# Patient Record
Sex: Male | Born: 2000 | Race: Black or African American | Hispanic: No | Marital: Single | Smoking: Current some day smoker
Health system: Southern US, Community
[De-identification: ages and names within clinical notes are randomized; demographics above are authoritative.]

## PROBLEM LIST (undated history)

## (undated) DIAGNOSIS — F909 Attention-deficit hyperactivity disorder, unspecified type: Secondary | ICD-10-CM

---
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
## 2021-01-16 ENCOUNTER — Other Ambulatory Visit: Payer: Self-pay

## 2021-01-16 ENCOUNTER — Encounter (HOSPITAL_COMMUNITY): Payer: Self-pay | Admitting: Emergency Medicine

## 2021-01-16 ENCOUNTER — Ambulatory Visit (HOSPITAL_COMMUNITY)
Admission: EM | Admit: 2021-01-16 | Discharge: 2021-01-16 | Disposition: A | Discharge status: Home / Self Care | Payer: Medicaid Other | Attending: Family Medicine | Admitting: Family Medicine

## 2021-01-16 DIAGNOSIS — K0889 Other specified disorders of teeth and supporting structures: Secondary | ICD-10-CM

## 2021-01-16 DIAGNOSIS — R030 Elevated blood-pressure reading, without diagnosis of hypertension: Secondary | ICD-10-CM

## 2021-01-16 MED ORDER — AMOXICILLIN 875 MG PO TABS: 875.0000 mg | ORAL_TABLET | Freq: Two times a day (BID) | ORAL | 0 refills | Status: AC

## 2021-01-16 MED ORDER — HYDROCODONE-ACETAMINOPHEN 5-325 MG PO TABS: 1.0000 | ORAL_TABLET | Freq: Four times a day (QID) | ORAL | 0 refills | Status: DC | PRN

## 2021-01-16 NOTE — ED Triage Notes (Signed)
Patient reports having right lower jaw pain.  Patient say he has tried to see a dentist, but appt (s) too far out on schedule.  Pain is in right lower jaw and right neck and top of right shoulder

## 2021-01-16 NOTE — Discharge Instructions (Addendum)
Be aware, you have been prescribed pain medications that may cause drowsiness. While taking this medication, do not take any other medications containing acetaminophen (Tylenol). Do not combine with alcohol or other illicit drugs. Please do not drive, operate heavy machinery, or take part in activities that require making important decisions while on this medication as your judgement may be clouded.  Your blood pressure was noted to be elevated during your visit today. If you are currently taking medication for high blood pressure, please ensure you are taking this as directed. If you do not have a history of high blood pressure and your blood pressure remains persistently elevated, you may need to begin taking a medication at some point. You may return here within the next few days to recheck if unable to see your primary care provider or if you do not have a one.  BP (!) 143/100 (BP Location: Left Arm)   Pulse 82   Temp 98.7 F (37.1 C) (Oral)   Resp 20   SpO2 98%   BP Readings from Last 3 Encounters:  01/16/21 (!) 143/100  12/21/20 106/68  12/20/20 (!) 108/93

## 2021-01-18 NOTE — ED Provider Notes (Signed)
[facility] CARE [facility]ENTER   [id number] 01/16/21 Arrival Time: 1528  ASSESSMENT & PLAN:  1. Pain, dental   2. Elevated blood pressure reading in office without diagnosis of hypertension    No sign of abscess requiring I&D at this time. Discussed.  Meds ordered this encounter  Medications   HYDROcodone-acetaminophen (NORCO/VICODIN) 5-325 MG tablet    Sig: Take 1 tablet by mouth every 6 (six) hours as needed for moderate pain or severe pain.    Dispense:  8 tablet    Refill:  0   amoxicillin (AMOXIL) 875 MG tablet    Sig: Take 1 tablet (875 mg total) by mouth 2 (two) times daily for 10 days.    Dispense:  20 tablet    Refill:  0    [place] Controlled Substances Registry consulted for this patient. I feel the risk/benefit ratio today is favorable for proceeding with this prescription for a controlled substance. Medication sedation precautions given.  Dental resource written instructions given. He will schedule dental evaluation as soon as possible if not improving over the next 24-48 hours.    Discharge Instructions      Be aware, you have been prescribed pain medications that may cause drowsiness. While taking this medication, do not take any other medications containing acetaminophen (Tylenol). Do not combine with alcohol or other illicit drugs. Please do not drive, operate heavy machinery, or take part in activities that require making important decisions while on this medication as your judgement may be clouded.  Your blood pressure was noted to be elevated during your visit today. If you are currently taking medication for high blood pressure, please ensure you are taking this as directed. If you do not have a history of high blood pressure and your blood pressure remains persistently elevated, you may need to begin taking a medication at some point. You may return here within the next few days to recheck if unable to see your primary care provider or if you do not have a one.  BP (!)  143/100 (BP Location: Left Arm)   Pulse 82   Temp 98.7 F (37.1 C) (Oral)   Resp 20   SpO2 98%   BP Readings from Last 3 Encounters:  01/16/21 (!) 143/100  12/21/20 106/68  12/20/20 (!) 108/93        Reviewed expectations re: course of current medical issues. Questions answered. Outlined signs and symptoms indicating need for more acute intervention. Patient verbalized understanding. After Visit Summary given.   SUBJECTIVE:  Dan Morris is a 20 y.o. male who reports gradual onset of R lower tooth/jaw pain; fairly persistent; present few days. Fever: absent. Tolerating PO intake but reports pain with chewing. Normal swallowing. He does not see a dentist regularly. No neck swelling or pain. OTC analgesics without relief.  Increased blood pressure noted today. Reports that he has not been treated for hypertension in the past.  He reports no chest pain on exertion, no dyspnea on exertion, no swelling of ankles, no orthostatic dizziness or lightheadedness, no orthopnea or paroxysmal nocturnal dyspnea, and no palpitations.   OBJECTIVE: Vitals:   01/16/21 1550  BP: (!) 143/100  Pulse: 82  Resp: 20  Temp: 98.7 F (37.1 C)  TempSrc: Oral  SpO2: 98%    General appearance: alert; no distress HENT: normocephalic; atraumatic; dentition: poor; right lower gum without areas of fluctuance, drainage, or bleeding and with tenderness to palpation; normal jaw movement without difficulty Neck: supple without LAD; FROM; trachea midline CV: reg Lungs:  normal respirations; unlabored; speaks full sentences without difficulty Skin: warm and dry Psychological: alert and cooperative; normal mood and affect  No Known Allergies  Past Medical History:  Diagnosis Date   ADHD (attention deficit hyperactivity disorder)    Social History   Socioeconomic History   Marital status: Single    Spouse name: Not on file   Number of children: Not on file   Years of education: Not on file    Highest education level: Not on file  Occupational History   Not on file  Tobacco Use   Smoking status: Never   Smokeless tobacco: Never  Vaping Use   Vaping Use: Never used  Substance and Sexual Activity   Alcohol use: No   Drug use: No   Sexual activity: Not on file  Other Topics Concern   Not on file  Social History Narrative   Not on file   Social Determinants of Health   Financial Resource Strain: Not on file  Food Insecurity: Not on file  Transportation Needs: Not on file  Physical Activity: Not on file  Stress: Not on file  Social Connections: Not on file  Intimate Partner Violence: Not on file   No family history on file. History reviewed. No pertinent surgical history.    Mardella Layman, MD 01/18/21 1145

## 2021-03-12 ENCOUNTER — Other Ambulatory Visit (HOSPITAL_COMMUNITY): Payer: Self-pay

## 2021-06-02 ENCOUNTER — Encounter (HOSPITAL_BASED_OUTPATIENT_CLINIC_OR_DEPARTMENT_OTHER): Payer: Self-pay | Admitting: *Deleted

## 2021-06-02 ENCOUNTER — Emergency Department (HOSPITAL_BASED_OUTPATIENT_CLINIC_OR_DEPARTMENT_OTHER)
Admission: EM | Admit: 2021-06-02 | Discharge: 2021-06-02 | Disposition: A | Discharge status: Home / Self Care | Payer: Medicaid Other | Attending: Emergency Medicine | Admitting: Emergency Medicine

## 2021-06-02 ENCOUNTER — Other Ambulatory Visit: Payer: Self-pay

## 2021-06-02 DIAGNOSIS — Z79899 Other long term (current) drug therapy: Secondary | ICD-10-CM | POA: Diagnosis not present

## 2021-06-02 DIAGNOSIS — K0889 Other specified disorders of teeth and supporting structures: Secondary | ICD-10-CM | POA: Diagnosis present

## 2021-06-02 MED ORDER — AMOXICILLIN 500 MG PO CAPS
500.0000 mg | ORAL_CAPSULE | Freq: Three times a day (TID) | ORAL | 0 refills | Status: AC
Start: 2021-06-02 — End: ?

## 2021-06-02 MED ORDER — NAPROXEN 375 MG PO TABS
375.0000 mg | ORAL_TABLET | Freq: Two times a day (BID) | ORAL | 0 refills | Status: AC
Start: 2021-06-02 — End: ?

## 2021-06-02 NOTE — ED Provider Notes (Signed)
[place] EMERGENCY DEPARTMENT Provider Note   CSN: [id number] Arrival date & time: 06/02/21  1652     History  Chief Complaint  Patient presents with   Dental Pain    Dan Morris is a 21 y.o. male.  Patient presents with right lower dental pain x 1 day. Patient with known broken tooth. No fevers or chills. No trismus or difficulty swallowing.  The history is provided by the patient. No language interpreter was used.  Dental Pain Location:  Lower Lower teeth location:  30/RL 1st molar Quality:  Constant and localized Severity:  Moderate Onset quality:  Sudden Duration:  1 day Timing:  Constant Progression:  Worsening Context: dental fracture   Previous work-up:  Dental exam Associated symptoms: no difficulty swallowing, no drooling, no facial swelling, no fever and no gum swelling   Risk factors: no diabetes and no periodontal disease       Home Medications Prior to Admission medications   Medication Sig Start Date End Date Taking? Authorizing Provider  HYDROcodone-acetaminophen (NORCO/VICODIN) 5-325 MG tablet Take 1 tablet by mouth every 6 (six) hours as needed for moderate pain or severe pain. 01/16/21   Vanessa Kick, MD  lisdexamfetamine (VYVANSE) 50 MG capsule Take 50 mg by mouth daily.    [provider]  methocarbamol (ROBAXIN) 500 MG tablet Take 1 tablet (500 mg total) by mouth 3 (three) times daily as needed for muscle spasms. Patient not taking: Reported on 01/16/2021 12/21/20   Margarita Mail, PA-C  naproxen (NAPROSYN) 375 MG tablet Take 1 tablet (375 mg total) by mouth 2 (two) times daily with a meal. Patient not taking: Reported on 01/16/2021 12/21/20   Margarita Mail, PA-C      Allergies    Patient has no known allergies.    Review of Systems   Review of Systems  Constitutional:  Negative for fever.  HENT:  Positive for dental problem. Negative for drooling and facial swelling.   All other systems reviewed and are  negative.  Physical Exam Updated Vital Signs BP (!) 159/111 (BP Location: Right Arm) Comment: crying in triage , thrashing around in chair   Pulse (!) 51    Temp 98 F (36.7 C)    Resp 16    Ht 6\' 3"  (1.905 m)    Wt 78.9 kg    SpO2 99%    BMI 21.75 kg/m  Physical Exam Constitutional:      Appearance: Normal appearance.  HENT:     Head: Normocephalic.     Nose: Nose normal.     Mouth/Throat:     Mouth: Mucous membranes are moist.  Eyes:     Conjunctiva/sclera: Conjunctivae normal.  Cardiovascular:     Rate and Rhythm: Normal rate.  Pulmonary:     Effort: Pulmonary effort is normal.  Abdominal:     Palpations: Abdomen is soft.  Musculoskeletal:        General: Normal range of motion.     Cervical back: Normal range of motion and neck supple.  Skin:    General: Skin is warm and dry.  Neurological:     Mental Status: He is alert and oriented to person, place, and time.  Psychiatric:        Behavior: Behavior normal.    ED Results / Procedures / Treatments   Labs (all labs ordered are listed, but only abnormal results are displayed) Labs Reviewed - No data to display  EKG None  Radiology No results found.  Procedures  Procedures    Medications Ordered in ED Medications - No data to display  ED Course/ Medical Decision Making/ A&P                           Medical Decision Making  Patient with dentalgia.  No abscess requiring immediate incision and drainage.  Exam not concerning for Ludwig's angina or pharyngeal abscess.  Will treat with amoxicillin and naproxen. Pt instructed to follow-up with dentist.  Discussed return precautions. Pt safe for discharge.         Final Clinical Impression(s) / ED Diagnoses Final diagnoses:  Tooth pain    Rx / DC Orders ED Discharge Orders          Ordered    amoxicillin (AMOXIL) 500 MG capsule  3 times daily        06/02/21 1917    naproxen (NAPROSYN) 375 MG tablet  2 times daily        06/02/21 1917               Etta Quill, NP 06/02/21 1918    Orpah Greek, MD 06/04/21 [id number]

## 2021-06-02 NOTE — Discharge Instructions (Addendum)
Please refer to the attached instructions. Take medication as directed. Follow-up with a dental provider.

## 2021-06-02 NOTE — ED Triage Notes (Signed)
C/o dental pain x 1 hr

## 2021-07-27 ENCOUNTER — Emergency Department (HOSPITAL_BASED_OUTPATIENT_CLINIC_OR_DEPARTMENT_OTHER)
Admission: EM | Admit: 2021-07-27 | Discharge: 2021-07-27 | Disposition: A | Discharge status: Home / Self Care | Payer: Medicaid Other | Attending: Emergency Medicine | Admitting: Emergency Medicine

## 2021-07-27 ENCOUNTER — Other Ambulatory Visit (HOSPITAL_BASED_OUTPATIENT_CLINIC_OR_DEPARTMENT_OTHER): Payer: Self-pay

## 2021-07-27 ENCOUNTER — Encounter (HOSPITAL_BASED_OUTPATIENT_CLINIC_OR_DEPARTMENT_OTHER): Payer: Self-pay | Admitting: *Deleted

## 2021-07-27 ENCOUNTER — Other Ambulatory Visit: Payer: Self-pay

## 2021-07-27 DIAGNOSIS — J029 Acute pharyngitis, unspecified: Secondary | ICD-10-CM | POA: Insufficient documentation

## 2021-07-27 DIAGNOSIS — Z20822 Contact with and (suspected) exposure to covid-19: Secondary | ICD-10-CM | POA: Diagnosis not present

## 2021-07-27 LAB — RESP PANEL BY RT-PCR (FLU A&B, COVID) ARPGX2
Influenza A by PCR: NEGATIVE
Influenza B by PCR: NEGATIVE
SARS Coronavirus 2 by RT PCR: NEGATIVE

## 2021-07-27 LAB — GROUP A STREP BY PCR: Group A Strep by PCR: NOT DETECTED

## 2021-07-27 MED ORDER — DEXAMETHASONE 10 MG/ML FOR PEDIATRIC ORAL USE
10.0000 mg | Freq: Once | INTRAMUSCULAR | Status: AC
  Administered 2021-07-27: 10 mg via ORAL
  Filled 2021-07-27: qty 1

## 2021-07-27 MED ORDER — CLINDAMYCIN HCL 150 MG PO CAPS
300.0000 mg | ORAL_CAPSULE | Freq: Three times a day (TID) | ORAL | 0 refills | Status: AC
  Filled 2021-07-27: qty 60, 10d supply, fill #0

## 2021-07-27 NOTE — Discharge Instructions (Addendum)
I have prescribed you antibiotics for suspected bacterial pharyngitis. Please return to ED if symptoms continue to worsen. ?

## 2021-07-27 NOTE — ED Triage Notes (Signed)
Sore throat, chills, body aches since yesterday. States his boss has the flu ?

## 2021-07-27 NOTE — ED Provider Notes (Signed)
?[facility] EMERGENCY DEPARTMENT ?Provider Note ? ? ?CSN: [id number] ?Arrival date & time: 07/27/21  1553 ? ?  ? ?History ? ?Chief Complaint  ?Patient presents with  ? Sore Throat  ? ? ?Dan Morris is a 21 y.o. male. ?Sore throat ongoing for 4 days. He has general malaise, fatigue, and weakness.  He has also had fevers. No ear pain, difficulty tolerating PO, or neck swelling. ? ?Sore Throat ? ? ?  ? ?Home Medications ?Prior to Admission medications   ?Medication Sig Start Date End Date Taking? Authorizing Provider  ?clindamycin (CLEOCIN) 150 MG capsule Take 2 capsules (300 mg total) by mouth 3 (three) times daily for 10 days. 07/27/21 08/06/21 Yes Trina Asch, Finis BudGrace C, PA-C  ?amoxicillin (AMOXIL) 500 MG capsule Take 1 capsule (500 mg total) by mouth 3 (three) times daily. 06/02/21   Felicie MornSmith, David, NP  ?HYDROcodone-acetaminophen (NORCO/VICODIN) 5-325 MG tablet Take 1 tablet by mouth every 6 (six) hours as needed for moderate pain or severe pain. 01/16/21   Mardella LaymanHagler, Brian, MD  ?lisdexamfetamine (VYVANSE) 50 MG capsule Take 50 mg by mouth daily.    [provider]  ?methocarbamol (ROBAXIN) 500 MG tablet Take 1 tablet (500 mg total) by mouth 3 (three) times daily as needed for muscle spasms. ?Patient not taking: Reported on 01/16/2021 12/21/20   Arthor CaptainHarris, Abigail, PA-C  ?naproxen (NAPROSYN) 375 MG tablet Take 1 tablet (375 mg total) by mouth 2 (two) times daily with a meal. ?Patient not taking: Reported on 01/16/2021 12/21/20   Arthor CaptainHarris, Abigail, PA-C  ?naproxen (NAPROSYN) 375 MG tablet Take 1 tablet (375 mg total) by mouth 2 (two) times daily. 06/02/21   Felicie MornSmith, David, NP  ?   ? ?Allergies    ?Patient has no known allergies.   ? ?Review of Systems   ?Review of Systems  ?Constitutional:  Positive for chills, fatigue and fever.  ?HENT:  Positive for sore throat. Negative for congestion and ear pain.   ?Respiratory:  Negative for cough.   ?All other systems reviewed and are negative. ? ?Physical Exam ?Updated Vital  Signs ?BP 134/85 (BP Location: Right Arm)   Pulse 93   Temp 98.8 ?F (37.1 ?C) (Oral)   Resp 16   Ht 6\' 4"  (1.93 m)   SpO2 100%   BMI 21.18 kg/m?  ?Physical Exam ?Vitals and nursing note reviewed.  ?Constitutional:   ?   General: He is not in acute distress. ?   Appearance: Normal appearance. He is well-developed. He is not ill-appearing, toxic-appearing or diaphoretic.  ?HENT:  ?   Head: Normocephalic and atraumatic.  ?   Right Ear: Tympanic membrane and ear canal normal. No drainage, swelling or tenderness. No middle ear effusion. Tympanic membrane is not erythematous.  ?   Left Ear: Tympanic membrane and ear canal normal. No drainage, swelling or tenderness.  No middle ear effusion. Tympanic membrane is not erythematous.  ?   Nose: No nasal deformity, congestion or rhinorrhea.  ?   Mouth/Throat:  ?   Lips: Pink. No lesions.  ?   Mouth: Mucous membranes are moist.  ?   Pharynx: Uvula midline. Oropharyngeal exudate, posterior oropharyngeal erythema and uvula swelling present.  ?   Tonsils: Tonsillar exudate present. No tonsillar abscesses. 2+ on the right. 2+ on the left.  ?Eyes:  ?   General: Gaze aligned appropriately. No scleral icterus.    ?   Right eye: No discharge.     ?   Left eye: No discharge.  ?  Conjunctiva/sclera: Conjunctivae normal.  ?   Right eye: Right conjunctiva is not injected. No exudate or hemorrhage. ?   Left eye: Left conjunctiva is not injected. No exudate or hemorrhage. ?Neck:  ?   Comments: Anterior cervical adenopathy bilaterally ?Cardiovascular:  ?   Rate and Rhythm: Normal rate and regular rhythm.  ?   Heart sounds: Normal heart sounds. No murmur heard. ?  No friction rub. No gallop.  ?Pulmonary:  ?   Effort: Pulmonary effort is normal. No respiratory distress.  ?   Breath sounds: Normal breath sounds. No stridor. No wheezing, rhonchi or rales.  ?Chest:  ?   Chest wall: No tenderness.  ?Abdominal:  ?   General: There is no distension.  ?   Palpations: Abdomen is soft.  ?    Tenderness: There is no abdominal tenderness. There is no guarding or rebound.  ?Lymphadenopathy:  ?   Cervical: Cervical adenopathy present.  ?Skin: ?   General: Skin is warm and dry.  ?Neurological:  ?   Mental Status: He is alert and oriented to person, place, and time.  ?Psychiatric:     ?   Mood and Affect: Mood normal.     ?   Speech: Speech normal.     ?   Behavior: Behavior normal. Behavior is cooperative.  ? ? ?ED Results / Procedures / Treatments   ?Labs ?(all labs ordered are listed, but only abnormal results are displayed) ?Labs Reviewed  ?RESP PANEL BY RT-PCR (FLU A&B, COVID) [id number]  ?GROUP A STREP BY PCR  ? ? ?EKG ?None ? ?Radiology ?No results found. ? ?Procedures ?Procedures  ? ? ?Medications Ordered in ED ?Medications  ?dexamethasone (DECADRON) 10 MG/ML injection for Pediatric ORAL use 10 mg (10 mg Oral Given 07/27/21 1706)  ? ? ?ED Course/ Medical Decision Making/ A&P ?  ?                        ?Medical Decision Making ?Risk ?Prescription drug management. ? ? ? ?MDM  ?This is a 21 y.o. male who presents to the ED with sore throat. ?The differential of this patient includes but is not limited to Gonorrhea/Chlamydia, Ludwig's Angina, Peritonsillar Abscess, Retropharyngeal Abscess, Strep throat, and Viral Pharyngitis ? ? ?My Impression, Plan, and ED Course: Patient with normal vitals. Well appearing. HENT exam is slightly concerning with tonsillar exudates, bilateral swelling, erythema, and uvular swelling. He has anterior lymphadenopathy as well as reported fever. This is highly suspicious for strep throat. Doubt PTA, RPA, Ludwigs,.  ? ?I personally ordered, reviewed, and interpreted all laboratory work and imaging and agree with radiologist interpretation. Results interpreted below: COVID/Flu, strep negative ? ? ?Despite negative test results, I am still concerned that this is a false negative and bacterial cause of symptoms are present. Will treat ppx with Clinda and have patient return if symptoms  worsen . ? ?Charting Requirements ?Additional history is obtained from:  Independent historian ?External Records from outside source obtained and reviewed including: n/a ?Social Determinants of Health:  none ?Pertinant PMH that complicates patient's illness: n/a ? ?Patient Care ?Problems that were addressed during this visit: ?- Sore throat: Acute illness with complication ?Medications given in ED: Decadron ?Reevaluation of the patient after these medicines showed that the patient stayed the same ?Disposition: abx, return precautions ? ? ?Portions of this note were generated with Scientist, clinical (histocompatibility and immunogenetics). Dictation errors may occur despite best attempts at proofreading. ?  ? ?  ?  ? ?  Final Clinical Impression(s) / ED Diagnoses ?Final diagnoses:  ?Pharyngitis, unspecified etiology  ? ? ?Rx / DC Orders ?ED Discharge Orders   ? ?      Ordered  ?  clindamycin (CLEOCIN) 150 MG capsule  3 times daily       ? 07/27/21 1700  ? ?  ?  ? ?  ? ? ?  ?Claudie Leach, PA-C ?07/27/21 1935 ? ?  ?Milagros Loll, MD ?07/30/21 1700 ? ?
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
## 2022-09-04 ENCOUNTER — Other Ambulatory Visit: Payer: Self-pay

## 2022-09-04 ENCOUNTER — Emergency Department (HOSPITAL_BASED_OUTPATIENT_CLINIC_OR_DEPARTMENT_OTHER): Payer: Medicaid Other

## 2022-09-04 ENCOUNTER — Emergency Department (HOSPITAL_BASED_OUTPATIENT_CLINIC_OR_DEPARTMENT_OTHER)
Admission: EM | Admit: 2022-09-04 | Discharge: 2022-09-04 | Disposition: A | Discharge status: Home / Self Care | Payer: Medicaid Other | Attending: Emergency Medicine | Admitting: Emergency Medicine

## 2022-09-04 ENCOUNTER — Encounter (HOSPITAL_BASED_OUTPATIENT_CLINIC_OR_DEPARTMENT_OTHER): Payer: Self-pay

## 2022-09-04 DIAGNOSIS — R1110 Vomiting, unspecified: Secondary | ICD-10-CM

## 2022-09-04 DIAGNOSIS — R112 Nausea with vomiting, unspecified: Secondary | ICD-10-CM | POA: Insufficient documentation

## 2022-09-04 DIAGNOSIS — R1013 Epigastric pain: Secondary | ICD-10-CM | POA: Insufficient documentation

## 2022-09-04 LAB — COMPREHENSIVE METABOLIC PANEL
ALT: 17 U/L (ref 0–44)
AST: 24 U/L (ref 15–41)
Albumin: 4.5 g/dL (ref 3.5–5.0)
Alkaline Phosphatase: 32 U/L — ABNORMAL LOW (ref 38–126)
Anion gap: 11 (ref 5–15)
BUN: 25 mg/dL — ABNORMAL HIGH (ref 6–20)
CO2: 23 mmol/L (ref 22–32)
Calcium: 9.1 mg/dL (ref 8.9–10.3)
Chloride: 102 mmol/L (ref 98–111)
Creatinine, Ser: 1.12 mg/dL (ref 0.61–1.24)
GFR, Estimated: >60 mL/min (ref >60)
Glucose, Bld: 100 mg/dL — ABNORMAL HIGH (ref 70–99)
Potassium: 3.4 mmol/L — ABNORMAL LOW (ref 3.5–5.1)
Sodium: 136 mmol/L (ref 135–145)
Total Bilirubin: 1.5 mg/dL — ABNORMAL HIGH (ref 0.3–1.2)
Total Protein: 7.8 g/dL (ref 6.5–8.1)

## 2022-09-04 LAB — CBC WITH DIFFERENTIAL/PLATELET
Abs Immature Granulocytes: 0.01 10*3/uL (ref 0.00–0.07)
Basophils Absolute: 0 10*3/uL (ref 0.0–0.1)
Basophils Relative: 0 %
Eosinophils Absolute: 0 10*3/uL (ref 0.0–0.5)
Eosinophils Relative: 0 %
HCT: 45 % (ref 39.0–52.0)
Hemoglobin: 15.4 g/dL (ref 13.0–17.0)
Immature Granulocytes: 0 %
Lymphocytes Relative: 17 %
Lymphs Abs: 1.2 10*3/uL (ref 0.7–4.0)
MCH: 29.3 pg (ref 26.0–34.0)
MCHC: 34.2 g/dL (ref 30.0–36.0)
MCV: 85.7 fL (ref 80.0–100.0)
Monocytes Absolute: 0.7 10*3/uL (ref 0.1–1.0)
Monocytes Relative: 11 %
Neutro Abs: 4.9 10*3/uL (ref 1.7–7.7)
Neutrophils Relative %: 72 %
Platelets: 288 10*3/uL (ref 150–400)
RBC: 5.25 MIL/uL (ref 4.22–5.81)
RDW: 12.5 % (ref 11.5–15.5)
WBC: 6.8 10*3/uL (ref 4.0–10.5)
nRBC: 0 % (ref 0.0–0.2)

## 2022-09-04 LAB — LIPASE, BLOOD: Lipase: 23 U/L (ref 11–51)

## 2022-09-04 MED ORDER — SUCRALFATE 1 G PO TABS: 1.0000 g | ORAL_TABLET | Freq: Three times a day (TID) | ORAL | 0 refills | Status: AC

## 2022-09-04 MED ORDER — ONDANSETRON HCL 4 MG/2ML IJ SOLN
4.0000 mg | Freq: Once | INTRAMUSCULAR | Status: AC
  Administered 2022-09-04: 4 mg via INTRAVENOUS
  Filled 2022-09-04: qty 2

## 2022-09-04 MED ORDER — ONDANSETRON 4 MG PO TBDP: 4.0000 mg | ORAL_TABLET | Freq: Three times a day (TID) | ORAL | 0 refills | Status: AC | PRN

## 2022-09-04 MED ORDER — PANTOPRAZOLE SODIUM 20 MG PO TBEC: 20.0000 mg | DELAYED_RELEASE_TABLET | Freq: Every day | ORAL | 0 refills | Status: AC

## 2022-09-04 MED ORDER — ALUM & MAG HYDROXIDE-SIMETH 200-200-20 MG/5ML PO SUSP
30.0000 mL | Freq: Once | ORAL | Status: AC
  Administered 2022-09-04: 30 mL via ORAL
  Filled 2022-09-04: qty 30

## 2022-09-04 MED ORDER — SODIUM CHLORIDE 0.9 % IV BOLUS
1000.0000 mL | Freq: Once | INTRAVENOUS | Status: AC
  Administered 2022-09-04: 1000 mL via INTRAVENOUS

## 2022-09-04 NOTE — ED Triage Notes (Signed)
Pt reports that he came to ED for complaints of chest pain and SOB. States it started this am.

## 2022-09-04 NOTE — ED Provider Notes (Signed)
[facility] AT [facility] Provider Note   CSN: [id number] Arrival date & time: 09/04/22  0706     History  Chief Complaint  Patient presents with   Emesis   Abdominal Pain   Chest Pain    Dan Morris is a 22 y.o. male.  Patient here with nausea vomiting episode.  Upper abdominal pain radiating to his chest.  Denies any suspicious food intake or alcohol use or heavy ibuprofen use.  Denies any black or bloody stools.  Denies any fevers or chills or cough or sputum production.  No significant medical history.  Denies any weakness, numbness, chills.  The history is provided by the patient.       Home Medications Prior to Admission medications   Medication Sig Start Date End Date Taking? Authorizing Provider  ondansetron (ZOFRAN-ODT) 4 MG disintegrating tablet Take 1 tablet (4 mg total) by mouth every 8 (eight) hours as needed for nausea or vomiting. 09/04/22  Yes Lehua Flores, DO  pantoprazole (PROTONIX) 20 MG tablet Take 1 tablet (20 mg total) by mouth daily for 14 days. 09/04/22 09/18/22 Yes Yola Paradiso, DO  sucralfate (CARAFATE) 1 g tablet Take 1 tablet (1 g total) by mouth 4 (four) times daily -  with meals and at bedtime for 14 days. 09/04/22 09/18/22 Yes Kateryna Grantham, DO  amoxicillin (AMOXIL) 500 MG capsule Take 1 capsule (500 mg total) by mouth 3 (three) times daily. 06/02/21   Felicie Morn, NP  HYDROcodone-acetaminophen (NORCO/VICODIN) 5-325 MG tablet Take 1 tablet by mouth every 6 (six) hours as needed for moderate pain or severe pain. 01/16/21   Mardella Layman, MD  lisdexamfetamine (VYVANSE) 50 MG capsule Take 50 mg by mouth daily.    [provider]  methocarbamol (ROBAXIN) 500 MG tablet Take 1 tablet (500 mg total) by mouth 3 (three) times daily as needed for muscle spasms. Patient not taking: Reported on 01/16/2021 12/21/20   Arthor Captain, PA-C  naproxen (NAPROSYN) 375 MG tablet Take 1 tablet (375 mg total) by mouth 2 (two)  times daily with a meal. Patient not taking: Reported on 01/16/2021 12/21/20   Arthor Captain, PA-C  naproxen (NAPROSYN) 375 MG tablet Take 1 tablet (375 mg total) by mouth 2 (two) times daily. 06/02/21   Felicie Morn, NP      Allergies    Patient has no known allergies.    Review of Systems   Review of Systems  Physical Exam Updated Vital Signs BP 137/85 (BP Location: Left Arm)   Pulse 85   Temp 98.1 F (36.7 C) (Oral)   Resp 18   Ht 6\' 3"  (1.905 m)   Wt 79.4 kg   SpO2 100%   BMI 21.87 kg/m  Physical Exam Vitals and nursing note reviewed.  Constitutional:      General: He is not in acute distress.    Appearance: He is well-developed.  HENT:     Head: Normocephalic and atraumatic.     Mouth/Throat:     Mouth: Mucous membranes are moist.  Eyes:     Extraocular Movements: Extraocular movements intact.     Conjunctiva/sclera: Conjunctivae normal.     Pupils: Pupils are equal, round, and reactive to light.  Cardiovascular:     Rate and Rhythm: Normal rate and regular rhythm.     Heart sounds: No murmur heard. Pulmonary:     Effort: Pulmonary effort is normal. No respiratory distress.     Breath sounds: Normal breath sounds.  Abdominal:  Palpations: Abdomen is soft.     Tenderness: There is abdominal tenderness in the epigastric area.  Musculoskeletal:        General: No swelling.     Cervical back: Neck supple.  Skin:    General: Skin is warm and dry.     Capillary Refill: Capillary refill takes less than 2 seconds.  Neurological:     General: No focal deficit present.     Mental Status: He is alert.  Psychiatric:        Mood and Affect: Mood normal.     ED Results / Procedures / Treatments   Labs (all labs ordered are listed, but only abnormal results are displayed) Labs Reviewed  COMPREHENSIVE METABOLIC PANEL - Abnormal; Notable for the following components:      Result Value   Potassium 3.4 (*)    Glucose, Bld 100 (*)    BUN 25 (*)    Alkaline  Phosphatase 32 (*)    Total Bilirubin 1.5 (*)    All other components within normal limits  CBC WITH DIFFERENTIAL/PLATELET  LIPASE, BLOOD    EKG EKG Interpretation  Date/Time:  Sunday Sep 04 2022 07:18:06 EDT Ventricular Rate:  85 PR Interval:  159 QRS Duration: 92 QT Interval:  353 QTC Calculation: 420 R Axis:   91 Text Interpretation: Sinus rhythm Borderline right axis deviation LVH by voltage Confirmed by Virgina Norfolk ([id number]) on 09/04/2022 7:31:57 AM  Radiology DG Chest Portable 1 View  Result Date: 09/04/2022 CLINICAL DATA:  22 year old male with history of shortness of breath. EXAM: PORTABLE CHEST 1 VIEW COMPARISON:  Chest x-ray 06/09/2020. FINDINGS: Lung volumes are normal. No consolidative airspace disease. No pleural effusions. No pneumothorax. Multiple small calcified granulomas are noted in the lungs, right greater than left. No other larger suspicious appearing pulmonary nodule or mass noted. Pulmonary vasculature and the cardiomediastinal silhouette are within normal limits. IMPRESSION: 1. No radiographic evidence of acute cardiopulmonary disease. 2. Old granulomatous disease, as above. Electronically Signed   By: Trudie Reed M.D.   On: 09/04/2022 08:07    Procedures Procedures    Medications Ordered in ED Medications  alum & mag hydroxide-simeth (MAALOX/MYLANTA) 200-200-20 MG/5ML suspension 30 mL (30 mLs Oral Given 09/04/22 0737)  ondansetron (ZOFRAN) injection 4 mg (4 mg Intravenous Given 09/04/22 0737)  sodium chloride 0.9 % bolus 1,000 mL (1,000 mLs Intravenous New Bag/Given 09/04/22 0736)    ED Course/ Medical Decision Making/ A&P                             Medical Decision Making Amount and/or Complexity of Data Reviewed Labs: ordered. Radiology: ordered.  Risk OTC drugs. Prescription drug management.   Dan Morris is here with upper abdominal pain/chest pain.  Normal vitals.  No fever.  Differential diagnosis likely stomach  inflammation/viral process.  EKG shows sinus rhythm.  No ischemic changes.  Doubt ACS or cardiac process.  Will get chest x-ray to evaluate for pneumonia, pneumothorax, will check labs to evaluate for pancreatitis, cholecystitis.  Will give GI cocktail, Zofran, IV fluids and reevaluate.  Per my review interpretation labs no significant anemia, electrolyte abnormality, kidney injury or leukocytosis.  Chest x-ray per my review and interpretation shows no evidence of pneumonia or pneumothorax.  EKG shows sinus rhythm.  No ischemic changes.  Overall he is feeling much better after GI cocktail, Zofran and fluids.  I do suspect that this is more GI related pain.  Will start him on Carafate and Protonix and will prescribe him Zofran.  He understands return precautions.  Discharged in good condition.  This chart was dictated using voice recognition software.  Despite best efforts to proofread,  errors can occur which can change the documentation meaning.         Final Clinical Impression(s) / ED Diagnoses Final diagnoses:  Vomiting, unspecified vomiting type, unspecified whether nausea present    Rx / DC Orders ED Discharge Orders          Ordered    ondansetron (ZOFRAN-ODT) 4 MG disintegrating tablet  Every 8 hours PRN        09/04/22 0817    pantoprazole (PROTONIX) 20 MG tablet  Daily        09/04/22 0817    sucralfate (CARAFATE) 1 g tablet  3 times daily with meals & bedtime        09/04/22 0817              Virgina Norfolk, DO 09/04/22 0818

## 2023-02-18 NOTE — ED Triage Notes (Signed)
 Removal of stitches left hand, thumb

## 2023-02-18 NOTE — ED Provider Notes (Signed)
 [facility] Emergency Department Emergency Department Provider Note  This document was created using the aid of voice recognition Dragon dictation software.   Provider at bedside: 02/18/2023 3:18 PM  History obtained from the: Patient  History   Chief Complaint  Patient presents with   Suture / Staple Removal    HPI  Dan Morris is a 22 y.o. male who presents to the ED with complaints of suture removal.  Patient has a laceration to the left thumb 7 days ago and had sutures placed in this ED.  He has been applying ointment that was given to him however he does admit to not keeping the area covered.  He denies any symptoms and states he feels well.  ______________________ ROS: Pertinent positives and negatives per HPI. Pertinent past medical, surgical, social and family history records were reviewed. Current Medications and Allergies were reviewed.  Physical Exam   Vitals:   02/18/23 1505 02/18/23 1507  BP: (!) 161/68   BP Location: Right arm   Patient Position: Sitting   Pulse: 85   Resp: 16   Temp: 98.2 F (36.8 C)   TempSrc: Oral   SpO2: 98% 98%    Physical Exam Vitals reviewed.  Constitutional:      General: He is not in acute distress.    Appearance: Normal appearance. He is not ill-appearing.  HENT:     Head: Normocephalic and atraumatic.     Nose: Nose normal.  Eyes:     Conjunctiva/sclera: Conjunctivae normal.  Cardiovascular:     Pulses: Normal pulses.  Pulmonary:     Effort: Pulmonary effort is normal.  Musculoskeletal:        General: Normal range of motion.     Cervical back: Normal range of motion.  Skin:    Findings: Wound present.     Comments: Well healed appearing wound to the left thumb on the dorsal side with 5 intact sutures.  No active drainage, swelling, tenderness, warmth or erythema.  Neurological:     Mental Status: He is alert. Mental status is at baseline.     Results  EKG Impression:  (Interpreted by  me)  ED Course     Clinical Complexity  Patient's presentation is most consistent with acute, uncomplicated illness.  Patient's Impaired access to primary care increases the complexity of managing their  presentation with suture.    Provider time spent in patient care today, inclusive of but not limited to clinical reassessment, review of diagnostic studies, and discharge preparation, was less than 30 minutes.   Procedure Note  Suture removal  Date/Time: 02/18/2023 3:00 PM  Performed by: Sheree Westley Tamea Mickey., PA-C Authorized by: Alm Ozell Perry, MD   Consent:    Consent obtained:  Verbal   Consent given by:  Patient   Risks, benefits, and alternatives were discussed: yes     Risks discussed:  Bleeding, pain and wound separation   Alternatives discussed:  Delayed treatment Universal protocol:    Procedure explained and questions answered to patient or proxy's satisfaction: yes     Patient identity confirmed:  Verbally with patient and arm band Location:    Location:  Upper extremity   Upper extremity location:  Hand   Hand location:  L thumb Procedure details:    Wound appearance:  Good wound healing, clean and draining   Drainage characteristics:  Pinpoint amount of grey drained after first stitch removal.   Number of sutures removed:  5 Post-procedure details:  Post-removal:  No dressing applied   Procedure completion:  Tolerated well, no immediate complications   Medical Decision Making  Medical Decision Making 22 year old male presents to the ED for suture removal.  Upon his evaluation patient is stable, nontoxic and in no acute distress.  Initial vital signs were significant for elevated blood pressure.  Physical exam was significant for finding noted above.  Wound appears clean, dry, and intact with no signs of evidence.  Sutures were removed and patient tolerated procedure well.  There was a pinpoint amount of gray discharge that showed on removal of the  first stitch  however at the removing all sutures there was no further evidence of further drainage.  I do not believe antibiotics are necessary at this time due to reassuring physical exam and reevaluation of the wound after all sutures were removed.  At this time patient is stable and appropriate for discharge.  Instruction were provided on return to the ED.  Patient understands and agree with the plan.  Problems Addressed: Visit for suture removal: acute illness or injury    Brief Overview Dan Morris is a 23 y.o. male who presents with complaint as per above.  I have reviewed the nursing documentation for past medical history, family history, and social history and agree.  Initial Differential Diagnoses: The differential include cellulitis, abscess, wound dehiscence.  Therapies: These medications and interventions were provided for the patient while in the ED.  Testing Results: Labs significant for : N/a  My interpretation of Imaging is significant for: All imaging was interpreted by radiologist and interpretation was reviewed by me.   All imaging studies and laboratory data were personally reviewed by me and incorporated into my medical decision making.  See the EMR for full details regarding lab and imaging results. Discrepancies: I reviewed the available nursing and EMS notes. There were the following discrepancies which I have clarified with the patient: None  Other: Other Documentation: I reviewed the patient's past medical history, including notes from our facility and what is available in CareEverywhere.  Consideration for hospitalization: No  Final Differential: The patient's presentation is most consistent with suture removal.   Post-ED Care: I believe that the patient is safe for discharge home with outpatient followup. We discussed following up PCP. I provided thorough ED return precautions. The patient feels safe and comfortable with this  plan.  Discussed findings, test results and plan of care with patient. Patient in agreement to plan of care. All questions were answered to the patient's satisfaction. Patient will be discharged with follow up to PCP or return to the ED if symptoms persist, worsen or change.  ED Clinical Impression  Clinical picture was discussed with patient along with risks and benefits of management options, shared decision making will discharge the patient home with close outpatient follow-up for continued management.  If patient was given medication(s), adverse effects, black box warnings, and drug interactions were discussed with patient.  Patient instructed to follow-up here in the emergency department for new or worsening symptoms.     1. Visit for suture removal    FOLLOW UP No follow-up provider specified.  ED Disposition     ED Disposition  Discharge   Condition  Stable   Comment  --        _____________________________

## 2024-04-13 ENCOUNTER — Emergency Department (HOSPITAL_BASED_OUTPATIENT_CLINIC_OR_DEPARTMENT_OTHER): Payer: Self-pay

## 2024-04-13 ENCOUNTER — Emergency Department (HOSPITAL_BASED_OUTPATIENT_CLINIC_OR_DEPARTMENT_OTHER)
Admission: EM | Admit: 2024-04-13 | Discharge: 2024-04-13 | Disposition: A | Discharge status: Home / Self Care | Payer: Self-pay | Attending: Emergency Medicine | Admitting: Emergency Medicine

## 2024-04-13 ENCOUNTER — Other Ambulatory Visit: Payer: Self-pay

## 2024-04-13 ENCOUNTER — Encounter (HOSPITAL_BASED_OUTPATIENT_CLINIC_OR_DEPARTMENT_OTHER): Payer: Self-pay | Admitting: Emergency Medicine

## 2024-04-13 DIAGNOSIS — S29012A Strain of muscle and tendon of back wall of thorax, initial encounter: Secondary | ICD-10-CM | POA: Insufficient documentation

## 2024-04-13 DIAGNOSIS — T148XXA Other injury of unspecified body region, initial encounter: Secondary | ICD-10-CM

## 2024-04-13 DIAGNOSIS — X58XXXA Exposure to other specified factors, initial encounter: Secondary | ICD-10-CM | POA: Insufficient documentation

## 2024-04-13 LAB — RESP PANEL BY RT-PCR (RSV, FLU A&B, COVID)  RVPGX2
Influenza A by PCR: NEGATIVE
Influenza B by PCR: NEGATIVE
Resp Syncytial Virus by PCR: NEGATIVE
SARS Coronavirus 2 by RT PCR: NEGATIVE

## 2024-04-13 MED ORDER — LIDOCAINE 5 % EX PTCH: 1.0000 | MEDICATED_PATCH | Freq: Every day | CUTANEOUS | 0 refills | Status: AC | PRN

## 2024-04-13 MED ORDER — KETOROLAC TROMETHAMINE 60 MG/2ML IM SOLN
15.0000 mg | Freq: Once | INTRAMUSCULAR | Status: AC
  Administered 2024-04-13: 15 mg via INTRAMUSCULAR
  Filled 2024-04-13: qty 2

## 2024-04-13 MED ORDER — HYDROCODONE-ACETAMINOPHEN 5-325 MG PO TABS
1.0000 | ORAL_TABLET | Freq: Once | ORAL | Status: AC
  Administered 2024-04-13: 1 via ORAL
  Filled 2024-04-13: qty 1

## 2024-04-13 MED ORDER — OXYCODONE HCL 5 MG PO TABS: 5.0000 mg | ORAL_TABLET | ORAL | 0 refills | Status: AC | PRN

## 2024-04-13 MED ORDER — CYCLOBENZAPRINE HCL 10 MG PO TABS: 10.0000 mg | ORAL_TABLET | Freq: Two times a day (BID) | ORAL | 0 refills | Status: AC | PRN

## 2024-04-13 MED ORDER — LIDOCAINE 5 % EX PTCH
1.0000 | MEDICATED_PATCH | Freq: Once | CUTANEOUS | Status: DC
  Administered 2024-04-13: 1 via TRANSDERMAL
  Filled 2024-04-13: qty 1

## 2024-04-13 NOTE — Discharge Instructions (Addendum)
 It was a pleasure caring for you today in the emergency department.  Please follow-up with your Primary Care Physician within the next week. Please take your medications as instructed and discuss any changes to your medications with your primary care physician.    Please return to the Emergency Department if you have any leg numbness, leg weakness, difficulty walking, fevers, worsening of pain, lightheadedness, lose consciousness, severe abdominal pain, severe headache, difficulty urinating, or difficulty having a bowel movement.   Please return to the emergency department immediately for any new or concerning symptoms, or if you get worse.

## 2024-04-13 NOTE — ED Provider Notes (Signed)
 " [employer] [place] [facility] AT [facility] Provider Note  CSN: [id number] Arrival date & time: 04/13/24 8084  Chief Complaint(s) Back Pain  HPI Dan Morris is a 23 y.o. male with past medical history as below, significant for ADHD who presents to the ED with complaint of thoracic back pain  Upper back pain ongoing around 1.5 to 2 weeks.  Denies any recent trauma or provoking event.  Pain worsened with arm movement or torso movement.  No neck pain or low back pain.  He is having intermittent nonproductive cough.  No chest pain, difficulty breathing, palpitations, leg swelling, nausea or vomiting or abdominal pain.  No relief with over-the-counter pain medications.  No history of VTE, no leg swelling or recent travel.  No fevers, no history of spinal instrumentation or injection, no IV drug use  Past Medical History Past Medical History:  Diagnosis Date   ADHD (attention deficit hyperactivity disorder)    There are no active problems to display for this patient.  Home Medication(s) Prior to Admission medications  Medication Sig Start Date End Date Taking? Authorizing Provider  cyclobenzaprine  (FLEXERIL ) 10 MG tablet Take 1 tablet (10 mg total) by mouth 2 (two) times daily as needed for muscle spasms. 04/13/24  Yes Elnor Savant A, DO  lidocaine  (LIDODERM ) 5 % Place 1 patch onto the skin daily as needed. Remove & Discard patch within 12 hours or as directed by MD 04/13/24  Yes Elnor Savant A, DO  oxyCODONE  (ROXICODONE ) 5 MG immediate release tablet Take 1 tablet (5 mg total) by mouth every 4 (four) hours as needed. 04/13/24  Yes Elnor Savant A, DO  amoxicillin  (AMOXIL ) 500 MG capsule Take 1 capsule (500 mg total) by mouth 3 (three) times daily. 06/02/21   Claudene Lenis, NP  lisdexamfetamine (VYVANSE) 50 MG capsule Take 50 mg by mouth daily.    [provider]  naproxen  (NAPROSYN ) 375 MG tablet Take 1 tablet (375 mg total) by mouth 2 (two) times daily with a  meal. Patient not taking: Reported on 01/16/2021 12/21/20   Harris, Abigail, PA-C  naproxen  (NAPROSYN ) 375 MG tablet Take 1 tablet (375 mg total) by mouth 2 (two) times daily. 06/02/21   Claudene Lenis, NP  ondansetron  (ZOFRAN -ODT) 4 MG disintegrating tablet Take 1 tablet (4 mg total) by mouth every 8 (eight) hours as needed for nausea or vomiting. 09/04/22   Curatolo, Adam, DO  pantoprazole  (PROTONIX ) 20 MG tablet Take 1 tablet (20 mg total) by mouth daily for 14 days. 09/04/22 09/18/22  Curatolo, Adam, DO  sucralfate  (CARAFATE ) 1 g tablet Take 1 tablet (1 g total) by mouth 4 (four) times daily -  with meals and at bedtime for 14 days. 09/04/22 09/18/22  Ruthe Cornet, DO  Past Surgical History History reviewed. No pertinent surgical history. Family History History reviewed. No pertinent family history.  Social History Social History[1] Allergies Patient has no known allergies.  Review of Systems A thorough review of systems was obtained and all systems are negative except as noted in the HPI and PMH.   Physical Exam Vital Signs  I have reviewed the triage vital signs BP 118/61   Pulse 63   Temp (!) 97.2 F (36.2 C)   Resp 20   Ht 6' 4 (1.93 m)   Wt 90.7 kg   SpO2 100%   BMI 24.34 kg/m  Physical Exam Vitals and nursing note reviewed.  Constitutional:      General: He is not in acute distress.    Appearance: He is well-developed.  HENT:     Head: Normocephalic and atraumatic.     Right Ear: External ear normal.     Left Ear: External ear normal.     Mouth/Throat:     Mouth: Mucous membranes are moist.  Eyes:     General: No scleral icterus. Cardiovascular:     Rate and Rhythm: Normal rate and regular rhythm.     Pulses: Normal pulses.     Heart sounds: Normal heart sounds.  Pulmonary:     Effort: Pulmonary effort is normal. No respiratory  distress.     Breath sounds: Normal breath sounds.  Abdominal:     General: Abdomen is flat.     Palpations: Abdomen is soft.     Tenderness: There is no abdominal tenderness.  Musculoskeletal:     Cervical back: No rigidity.       Back:     Right lower leg: No edema.     Left lower leg: No edema.     Comments: No midline spinous process tenderness to palpation or percussion, no crepitus or step-off.     Skin:    General: Skin is warm and dry.     Capillary Refill: Capillary refill takes less than 2 seconds.  Neurological:     Mental Status: He is alert and oriented to person, place, and time.     GCS: GCS eye subscore is 4. GCS verbal subscore is 5. GCS motor subscore is 6.     Cranial Nerves: No dysarthria or facial asymmetry.     Sensory: No sensory deficit.     Motor: No weakness.     Coordination: Coordination normal.     Gait: Gait normal.     Comments: Strength 5/5 to BLUE/BLLE, equal and symmetric    Psychiatric:        Mood and Affect: Mood normal.        Behavior: Behavior normal.     ED Results and Treatments Labs (all labs ordered are listed, but only abnormal results are displayed) Labs Reviewed  RESP PANEL BY RT-PCR (RSV, FLU A&B, COVID)  RVPGX2  Radiology DG Chest 2 View Result Date: 04/13/2024 EXAM: 2 VIEW(S) XRAY OF THE CHEST 04/13/2024 07:35:00 PM COMPARISON: 09/04/2022 . CLINICAL HISTORY: SOB (shortness of breath) FINDINGS: LUNGS AND PLEURA: No focal pulmonary opacity. No pleural effusion. No pneumothorax. HEART AND MEDIASTINUM: No acute abnormality of the cardiac and mediastinal silhouettes. BONES AND SOFT TISSUES: No acute osseous abnormality. IMPRESSION: 1. No acute cardiopulmonary process. Electronically signed by: Norman Gatlin MD 04/13/2024 07:41 PM EST RP Workstation: [id number]    Pertinent labs & imaging results that were  available during my care of the patient were reviewed by me and considered in my medical decision making (see MDM for details).  Medications Ordered in ED Medications  lidocaine  (LIDODERM ) 5 % 1 patch (1 patch Transdermal Patch Applied 04/13/24 2127)  ketorolac  (TORADOL ) injection 15 mg (15 mg Intramuscular Given 04/13/24 2129)  HYDROcodone -acetaminophen  (NORCO/VICODIN) 5-325 MG per tablet 1 tablet (1 tablet Oral Given 04/13/24 2128)                                                                                                                                     Procedures Procedures  (including critical care time)  Medical Decision Making / ED Course    Medical Decision Making:    Ashdon White-Dorsette is a 23 y.o. male  with past medical history as below, significant for ADHD who presents to the ED with complaint of thoracic back pain. The complaint involves an extensive differential diagnosis and also carries with it a high risk of complications and morbidity.  Serious etiology was considered. Ddx includes but is not limited to: Muscle strain or spasm, pneumonia, costochondritis, viral infection, VTE, etc.  Complete initial physical exam performed, notably the patient was in no acute distress, resting comfortably.    Reviewed and confirmed nursing documentation for past medical history, family history, social history.  Vital signs reviewed.    Thoracic back pain> - No midline TTP, no acute neurodeficits.  Pain is reproducible on palpation, no midline pain.  No neck pain or low back pain. -Chest x-ray ordered in triage is reassuring, RVP was also negative - PERC negative, low risk Wells, HEAR score is low.  I did offer laboratory evaluation however patient refuses, he would like to receive pain medication and be discharged.  Patient understands although he has low risk for VTE is difficult to rule out without laboratory evaluation.  He reports that he will follow-up with his PCP in the  office  Patient presents with back pain without signs of spinal cord compression, cauda equina syndrome, infection, aneurysm, or other serious etiology   10:34 PM:  I have discussed the diagnosis/risks/treatment options with the patient.  Evaluation and diagnostic testing in the emergency department does not suggest an emergent condition requiring admission or immediate intervention beyond what has been performed at this time.  They will follow up with PCP. We also discussed returning to the ED immediately if  new or worsening sx occur. We discussed the sx which are most concerning (e.g., sudden worsening pain, fever, inability to tolerate by mouth ***) that necessitate immediate return.    The patient appears reasonably screened and/or stabilized for discharge and I doubt any other medical condition or other [facility] requiring further screening, evaluation, or treatment in the ED at this time prior to discharge.                        Additional history obtained: -Additional history obtained from {wsadditionalhistorian:28072} -External records from outside source obtained and reviewed including: Chart review including previous notes, labs, imaging, consultation notes including  ***   Lab Tests: -I ordered, reviewed, and interpreted labs.   The pertinent results include:   Labs Reviewed  RESP PANEL BY RT-PCR (RSV, FLU A&B, COVID)  RVPGX2    Notable for ***  EKG   EKG Interpretation Date/Time:    Ventricular Rate:    PR Interval:    QRS Duration:    QT Interval:    QTC Calculation:   R Axis:      Text Interpretation:           Imaging Studies ordered: I ordered imaging studies including *** I independently visualized the following imaging with scope of interpretation limited to determining acute life threatening conditions related to emergency care; findings noted above I agree with the radiologist interpretation If any imaging was obtained with contrast I  closely monitored patient for any possible adverse reaction a/w contrast administration in the emergency department   Medicines ordered and prescription drug management: Meds ordered this encounter  Medications   ketorolac  (TORADOL ) injection 15 mg   HYDROcodone -acetaminophen  (NORCO/VICODIN) 5-325 MG per tablet 1 tablet    Refill:  0   lidocaine  (LIDODERM ) 5 % 1 patch   oxyCODONE  (ROXICODONE ) 5 MG immediate release tablet    Sig: Take 1 tablet (5 mg total) by mouth every 4 (four) hours as needed.    Dispense:  5 tablet    Refill:  0   cyclobenzaprine  (FLEXERIL ) 10 MG tablet    Sig: Take 1 tablet (10 mg total) by mouth 2 (two) times daily as needed for muscle spasms.    Dispense:  20 tablet    Refill:  0   lidocaine  (LIDODERM ) 5 %    Sig: Place 1 patch onto the skin daily as needed. Remove & Discard patch within 12 hours or as directed by MD    Dispense:  15 patch    Refill:  0    -I have reviewed the patients home medicines and have made adjustments as needed   Consultations Obtained: I requested consultation with the ***,  and discussed lab and imaging findings as well as pertinent plan - they recommend: ***   Cardiac Monitoring: The patient was maintained on a cardiac monitor.  I personally viewed and interpreted the cardiac monitored which showed an underlying rhythm of: *** Continuous pulse oximetry interpreted by myself, ***% on ***.    Social Determinants of Health:  Diagnosis or treatment significantly limited by social determinants of health: {wssoc:28071}   Reevaluation: After the interventions noted above, I reevaluated the patient and found that they have {resolved/improved/worsened:23923::improved}  Co morbidities that complicate the patient evaluation  Past Medical History:  Diagnosis Date   ADHD (attention deficit hyperactivity disorder)       Dispostion: Disposition decision including need for hospitalization was considered, and patient  {wsdispo:28070::discharged from emergency department.}  Final Clinical Impression(s) / ED Diagnoses Final diagnoses:  Muscle strain           [1] Social History Tobacco Use   Smoking status: Some Days    Types: Cigarettes   Smokeless tobacco: Never  Vaping Use   Vaping status: Some Days  Substance Use Topics   Alcohol use: No   Drug use: No  "

## 2024-04-13 NOTE — ED Triage Notes (Signed)
 Pt reports upper back pain for 2 wks, worse recently; cough noted; reports SHOB, NAD noted

## 2024-04-15 ENCOUNTER — Other Ambulatory Visit: Payer: Self-pay

## 2024-04-15 ENCOUNTER — Encounter (HOSPITAL_BASED_OUTPATIENT_CLINIC_OR_DEPARTMENT_OTHER): Payer: Self-pay | Admitting: Emergency Medicine

## 2024-04-15 ENCOUNTER — Emergency Department (HOSPITAL_BASED_OUTPATIENT_CLINIC_OR_DEPARTMENT_OTHER)
Admission: EM | Admit: 2024-04-15 | Discharge: 2024-04-15 | Disposition: A | Discharge status: Home / Self Care | Payer: Self-pay | Attending: Emergency Medicine | Admitting: Emergency Medicine

## 2024-04-15 DIAGNOSIS — R059 Cough, unspecified: Secondary | ICD-10-CM | POA: Insufficient documentation

## 2024-04-15 DIAGNOSIS — S29012D Strain of muscle and tendon of back wall of thorax, subsequent encounter: Secondary | ICD-10-CM | POA: Insufficient documentation

## 2024-04-15 DIAGNOSIS — X58XXXD Exposure to other specified factors, subsequent encounter: Secondary | ICD-10-CM | POA: Insufficient documentation

## 2024-04-15 LAB — CBC WITH DIFFERENTIAL/PLATELET
Abs Immature Granulocytes: 0 K/uL (ref 0.00–0.07)
Basophils Absolute: 0 K/uL (ref 0.0–0.1)
Basophils Relative: 0 %
Eosinophils Absolute: 0.2 K/uL (ref 0.0–0.5)
Eosinophils Relative: 3 %
HCT: 42.2 % (ref 39.0–52.0)
Hemoglobin: 14.4 g/dL (ref 13.0–17.0)
Immature Granulocytes: 0 %
Lymphocytes Relative: 62 %
Lymphs Abs: 3.9 K/uL (ref 0.7–4.0)
MCH: 29.7 pg (ref 26.0–34.0)
MCHC: 34.1 g/dL (ref 30.0–36.0)
MCV: 87 fL (ref 80.0–100.0)
Monocytes Absolute: 0.4 K/uL (ref 0.1–1.0)
Monocytes Relative: 7 %
Neutro Abs: 1.8 K/uL (ref 1.7–7.7)
Neutrophils Relative %: 28 %
Platelets: 278 K/uL (ref 150–400)
RBC: 4.85 MIL/uL (ref 4.22–5.81)
RDW: 12.7 % (ref 11.5–15.5)
WBC: 6.3 K/uL (ref 4.0–10.5)
nRBC: 0 % (ref 0.0–0.2)

## 2024-04-15 LAB — BASIC METABOLIC PANEL WITH GFR
Anion gap: 12 (ref 5–15)
BUN: 18 mg/dL (ref 6–20)
CO2: 25 mmol/L (ref 22–32)
Calcium: 9.3 mg/dL (ref 8.9–10.3)
Chloride: 103 mmol/L (ref 98–111)
Creatinine, Ser: 1.09 mg/dL (ref 0.61–1.24)
GFR, Estimated: >60 mL/min
Glucose, Bld: 101 mg/dL — ABNORMAL HIGH (ref 70–99)
Potassium: 3.9 mmol/L (ref 3.5–5.1)
Sodium: 140 mmol/L (ref 135–145)

## 2024-04-15 LAB — D-DIMER, QUANTITATIVE: D-Dimer, Quant: <0.27 ug{FEU}/mL (ref 0.00–0.50)

## 2024-04-15 MED ORDER — KETOROLAC TROMETHAMINE 30 MG/ML IJ SOLN
30.0000 mg | Freq: Once | INTRAMUSCULAR | Status: AC
  Administered 2024-04-15: 30 mg via INTRAMUSCULAR

## 2024-04-15 MED ORDER — KETOROLAC TROMETHAMINE 30 MG/ML IJ SOLN
30.0000 mg | Freq: Once | INTRAMUSCULAR | Status: DC
  Filled 2024-04-15: qty 1

## 2024-04-15 MED ORDER — DICLOFENAC SODIUM 75 MG PO TBEC: 75.0000 mg | DELAYED_RELEASE_TABLET | Freq: Two times a day (BID) | ORAL | 1 refills | Status: AC

## 2024-04-15 NOTE — ED Provider Notes (Signed)
 " [place] [facility] Provider Note   CSN: [id number] Arrival date & time: 04/15/24  8196     Patient presents with: Back Pain   Dan Morris is a 23 y.o. male who presents for pain in the middle of his back has been going on for release the last 2 weeks.  Previously seen on 20 December for the same, at that time had declined any lab evaluation for VTE, physical exam and chest x-ray did not show any acute abnormalities at that time and patient was managed for the likely muscular strain, outpatient management with cyclobenzaprine  and  Lidoderm  patches.  He continues to have similar type pain and is requesting reevaluation.  Has mild intermittent cough without any production, again had checked negative chest x-ray on 20 December.  Pain is increased with lateral rotation of the torso as well as with posterior and anterior flexion of the spine.  Is reproducible with palpation of the paraspinal muscles in the region.    Back Pain      Prior to Admission medications  Medication Sig Start Date End Date Taking? Authorizing Provider  diclofenac  (VOLTAREN ) 75 MG EC tablet Take 1 tablet (75 mg total) by mouth 2 (two) times daily. 04/15/24  Yes Myriam Dorn BROCKS, PA  amoxicillin  (AMOXIL ) 500 MG capsule Take 1 capsule (500 mg total) by mouth 3 (three) times daily. 06/02/21   Claudene Lenis, NP  cyclobenzaprine  (FLEXERIL ) 10 MG tablet Take 1 tablet (10 mg total) by mouth 2 (two) times daily as needed for muscle spasms. 04/13/24   Elnor Jayson LABOR, DO  lidocaine  (LIDODERM ) 5 % Place 1 patch onto the skin daily as needed. Remove & Discard patch within 12 hours or as directed by MD 04/13/24   Elnor Jayson LABOR, DO  lisdexamfetamine (VYVANSE) 50 MG capsule Take 50 mg by mouth daily.    [provider]  naproxen  (NAPROSYN ) 375 MG tablet Take 1 tablet (375 mg total) by mouth 2 (two) times daily with a meal. Patient not taking: Reported on 01/16/2021 12/21/20    Harris, Abigail, PA-C  naproxen  (NAPROSYN ) 375 MG tablet Take 1 tablet (375 mg total) by mouth 2 (two) times daily. 06/02/21   Claudene Lenis, NP  ondansetron  (ZOFRAN -ODT) 4 MG disintegrating tablet Take 1 tablet (4 mg total) by mouth every 8 (eight) hours as needed for nausea or vomiting. 09/04/22   Curatolo, Adam, DO  oxyCODONE  (ROXICODONE ) 5 MG immediate release tablet Take 1 tablet (5 mg total) by mouth every 4 (four) hours as needed. 04/13/24   Elnor Jayson LABOR, DO  pantoprazole  (PROTONIX ) 20 MG tablet Take 1 tablet (20 mg total) by mouth daily for 14 days. 09/04/22 09/18/22  Curatolo, Adam, DO  sucralfate  (CARAFATE ) 1 g tablet Take 1 tablet (1 g total) by mouth 4 (four) times daily -  with meals and at bedtime for 14 days. 09/04/22 09/18/22  Ruthe Cornet, DO    Allergies: Patient has no known allergies.    Review of Systems  Musculoskeletal:  Positive for back pain.  All other systems reviewed and are negative.   Updated Vital Signs BP 127/74 (BP Location: Right Arm)   Pulse 78   Temp 98.1 F (36.7 C)   Resp 16   SpO2 100%   Physical Exam Vitals and nursing note reviewed.  Constitutional:      General: He is not in acute distress.    Appearance: Normal appearance. He is normal weight.  HENT:  Head: Normocephalic and atraumatic.     Mouth/Throat:     Mouth: Mucous membranes are moist.     Pharynx: Oropharynx is clear.  Eyes:     Extraocular Movements: Extraocular movements intact.     Conjunctiva/sclera: Conjunctivae normal.     Pupils: Pupils are equal, round, and reactive to light.  Cardiovascular:     Rate and Rhythm: Normal rate and regular rhythm.     Pulses: Normal pulses.     Heart sounds: Normal heart sounds. No murmur heard.    No friction rub. No gallop.  Pulmonary:     Effort: Pulmonary effort is normal.     Breath sounds: Normal breath sounds.  Abdominal:     General: Abdomen is flat. Bowel sounds are normal.     Palpations: Abdomen is soft.   Musculoskeletal:        General: Normal range of motion.     Cervical back: Normal, normal range of motion and neck supple.     Thoracic back: Tenderness present. No bony tenderness.     Lumbar back: Normal.     Right lower leg: No edema.     Left lower leg: No edema.     Comments: Paraspinal tenderness noted throughout the T-spine, specifically through T4 inferiorly to T8.  Skin:    General: Skin is warm and dry.     Capillary Refill: Capillary refill takes less than 2 seconds.  Neurological:     General: No focal deficit present.     Mental Status: He is alert and oriented to person, place, and time. Mental status is at baseline.     GCS: GCS eye subscore is 4. GCS verbal subscore is 5. GCS motor subscore is 6.     Cranial Nerves: Cranial nerves 2-12 are intact.     Sensory: Sensation is intact.     Motor: Motor function is intact.     Coordination: Coordination is intact.     Gait: Gait is intact.     Comments: He is ambulatory without assistance and using a normal gait.  Psychiatric:        Mood and Affect: Mood normal.     (all labs ordered are listed, but only abnormal results are displayed) Labs Reviewed  BASIC METABOLIC PANEL WITH GFR - Abnormal; Notable for the following components:      Result Value   Glucose, Bld 101 (*)    All other components within normal limits  CBC WITH DIFFERENTIAL/PLATELET  D-DIMER, QUANTITATIVE (NOT AT [facility])    EKG: None  Radiology: No results found.   Procedures   Medications Ordered in the ED  ketorolac  (TORADOL ) 30 MG/ML injection 30 mg (30 mg Intramuscular Given 04/15/24 2111)                                    Medical Decision Making Amount and/or Complexity of Data Reviewed Labs: ordered.   Given history of present illness, along with previous presentation consider possible DVT as etiology, though suspect this is likely secondary to a muscular strain.  Differential also extensive possible pneumothorax, though  previous x-ray imaging as well as pulmonary auscultation does not show any clinical signs of pneumothorax at this time.  Further, consider possible pneumonia, though again there are no changes in the lung sounds there is there is no adventitious sounds appreciated, good air entry into all lung fields, and previous imaging did  not demonstrate pneumonia.  As x-ray imaging was obtained of the chest within the last 2 days we will defer further imaging at this time.  Patient consents to lab evaluation today, and as such we will obtain CBC, BMP, evaluate D-dimer to rule out possible VTE.  Review of the shows CMP and BMP within normal limits as is the D-dimer.  Given the reassuring lab evaluation today as well as previous reassuring imaging obtained 2 days prior, I find this is consistent with a muscular strain in the thoracic area of the back I will manage symptomatically with NSAID.  He will continue to use Lidoderm  patches as needed as well as heating pad application to the area.  Return precautions been discussed thoroughly with patient which he verbalizes understanding and agreement, otherwise will follow-up with primary care within the next 2 weeks for reevaluation.  As physical evaluation and lab evaluation today is unremarkable will discharge patient with outpatient follow-up as previously discussed.     Final diagnoses:  Strain of thoracic paraspinal muscles excluding T1 and T2 levels, subsequent encounter    ED Discharge Orders          Ordered    diclofenac  (VOLTAREN ) 75 MG EC tablet  2 times daily        04/15/24 2113               Myriam Dorn BROCKS, [place] 04/15/24 2115    Patt Alm Macho, MD 04/15/24 2128  "

## 2024-04-15 NOTE — ED Triage Notes (Signed)
 Pt reports right sided mid back pain x 2 weeks. Reports he was seen for same recently and states it hasn't gotten any better. Denies issues w/ urination. Denies any injuries to area, denies radiation.
